# Patient Record
Sex: Male | Born: 1965 | Race: White | Hispanic: No | Marital: Married | Smoking: Never smoker
Health system: Southern US, Community
[De-identification: ages and names within clinical notes are randomized; demographics above are authoritative.]

## PROBLEM LIST (undated history)

## (undated) DIAGNOSIS — G8929 Other chronic pain: Secondary | ICD-10-CM

## (undated) DIAGNOSIS — E291 Testicular hypofunction: Secondary | ICD-10-CM

## (undated) DIAGNOSIS — G629 Polyneuropathy, unspecified: Secondary | ICD-10-CM

## (undated) DIAGNOSIS — F419 Anxiety disorder, unspecified: Secondary | ICD-10-CM

## (undated) HISTORY — PX: ABDOMINAL SURGERY: SHX537

## (undated) HISTORY — PX: SHOULDER SURGERY: SHX246

---

## 2016-04-21 ENCOUNTER — Emergency Department (HOSPITAL_BASED_OUTPATIENT_CLINIC_OR_DEPARTMENT_OTHER)
Admission: EM | Admit: 2016-04-21 | Discharge: 2016-04-21 | Disposition: A | Discharge status: Home / Self Care | Payer: BLUE CROSS/BLUE SHIELD | Attending: Emergency Medicine | Admitting: Emergency Medicine

## 2016-04-21 ENCOUNTER — Encounter (HOSPITAL_BASED_OUTPATIENT_CLINIC_OR_DEPARTMENT_OTHER): Payer: Self-pay | Admitting: *Deleted

## 2016-04-21 DIAGNOSIS — E860 Dehydration: Secondary | ICD-10-CM | POA: Diagnosis not present

## 2016-04-21 DIAGNOSIS — R200 Anesthesia of skin: Secondary | ICD-10-CM | POA: Diagnosis present

## 2016-04-21 DIAGNOSIS — Z79899 Other long term (current) drug therapy: Secondary | ICD-10-CM | POA: Insufficient documentation

## 2016-04-21 DIAGNOSIS — R030 Elevated blood-pressure reading, without diagnosis of hypertension: Secondary | ICD-10-CM

## 2016-04-21 DIAGNOSIS — R5383 Other fatigue: Secondary | ICD-10-CM

## 2016-04-21 HISTORY — DX: Anxiety disorder, unspecified: F41.9

## 2016-04-21 LAB — CBC WITH DIFFERENTIAL/PLATELET
BASOS ABS: 0 10*3/uL (ref 0.0–0.1)
BASOS PCT: 0 %
EOS ABS: 0.3 10*3/uL (ref 0.0–0.7)
Eosinophils Relative: 4 %
HEMATOCRIT: 46.5 % (ref 39.0–52.0)
HEMOGLOBIN: 17.1 g/dL — AB (ref 13.0–17.0)
Lymphocytes Relative: 34 %
Lymphs Abs: 2.8 10*3/uL (ref 0.7–4.0)
MCH: 33.3 pg (ref 26.0–34.0)
MCHC: 36.8 g/dL — ABNORMAL HIGH (ref 30.0–36.0)
MCV: 90.6 fL (ref 78.0–100.0)
Monocytes Absolute: 0.8 10*3/uL (ref 0.1–1.0)
Monocytes Relative: 10 %
NEUTROS ABS: 4.4 10*3/uL (ref 1.7–7.7)
NEUTROS PCT: 53 %
Platelets: 244 10*3/uL (ref 150–400)
RBC: 5.13 MIL/uL (ref 4.22–5.81)
RDW: 11.6 % (ref 11.5–15.5)
WBC: 8.3 10*3/uL (ref 4.0–10.5)

## 2016-04-21 LAB — COMPREHENSIVE METABOLIC PANEL
ALBUMIN: 4 g/dL (ref 3.5–5.0)
ALK PHOS: 78 U/L (ref 38–126)
ALT: 26 U/L (ref 17–63)
ANION GAP: 11 (ref 5–15)
AST: 41 U/L (ref 15–41)
BILIRUBIN TOTAL: 0.7 mg/dL (ref 0.3–1.2)
BUN: 14 mg/dL (ref 6–20)
CO2: 25 mmol/L (ref 22–32)
Calcium: 8.8 mg/dL — ABNORMAL LOW (ref 8.9–10.3)
Chloride: 101 mmol/L (ref 101–111)
Creatinine, Ser: 0.95 mg/dL (ref 0.61–1.24)
GFR calc Af Amer: >60 mL/min (ref >60)
GFR calc non Af Amer: >60 mL/min (ref >60)
GLUCOSE: 90 mg/dL (ref 65–99)
POTASSIUM: 3.3 mmol/L — AB (ref 3.5–5.1)
SODIUM: 137 mmol/L (ref 135–145)
TOTAL PROTEIN: 7.3 g/dL (ref 6.5–8.1)

## 2016-04-21 LAB — CBG MONITORING, ED: GLUCOSE-CAPILLARY: 103 mg/dL — AB (ref 65–99)

## 2016-04-21 MED ORDER — GI COCKTAIL ~~LOC~~
30.0000 mL | Freq: Once | ORAL | Status: AC
  Administered 2016-04-21: 30 mL via ORAL
  Filled 2016-04-21: qty 30

## 2016-04-21 MED ORDER — SODIUM CHLORIDE 0.9 % IV BOLUS (SEPSIS)
1000.0000 mL | Freq: Once | INTRAVENOUS | Status: AC
  Administered 2016-04-21: 1000 mL via INTRAVENOUS

## 2016-04-21 NOTE — ED Notes (Signed)
Family at bedside. 

## 2016-04-21 NOTE — ED Notes (Signed)
ED Provider at bedside. 

## 2016-04-21 NOTE — ED Provider Notes (Signed)
[facility]   CSN: [id number] Arrival date & time: 04/21/16  1507 By signing my name below, I, Levon HedgerElizabeth Hall, attest that this documentation has been prepared under the direction and in the presence of Nira ConnPedro Eduardo Rayya Yagi, MD . Electronically Signed: Levon HedgerElizabeth Hall, Scribe. 04/21/2016. 4:15 PM.   History   Chief Complaint Chief Complaint  Patient presents with  . Numbness    HPI Jeremiah Jordan is a 50 y.o. male with a hx of anxiety who presents to the Emergency Department complaining of sudden onset, constant, worsening numbness to his bilateral hands which began at 2:40 this afternoon. He states he feels "constricted" and that it takes increased concentration and effort to lift his arms. Pt states he went to bed late last night after drinking more than usual and woke up at noon with "a basic hangover". He states he took his Ambien last night. Per pt, at 12:30 he felt as he was not safe to drive due to delayed responsiveness. He notes associated generalized weakness which he describes as feeling "weighted down". No alleviating or modifying factors noted.  No treatments tried PTA. Per pt, he started on Lexapro 2 months ago. No recent illnesses or infections. No hx of HTN, HLD or DM. Pt denies any fever, CP, SOB, nausea, or vomiting.   The history is provided by the patient. No language interpreter was used.   Past Medical History:  Diagnosis Date  . Anxiety     There are no active problems to display for this patient.   Past Surgical History:  Procedure Laterality Date  . ABDOMINAL SURGERY    . SHOULDER SURGERY      Home Medications    Prior to Admission medications   Medication Sig Start Date End Date Taking? Authorizing Provider  Escitalopram Oxalate (LEXAPRO PO) Take by mouth.   Yes Historical Provider, MD  Zolpidem Tartrate (AMBIEN PO) Take by mouth.   Yes Historical Provider, MD    Family History No family history on file.  Social  History Social History  Substance Use Topics  . Smoking status: Never Smoker  . Smokeless tobacco: Never Used  . Alcohol use Yes     Comment: daily     Allergies   Codeine   Review of Systems Review of Systems 10 systems reviewed and all are negative for acute change except as noted in the HPI.   Physical Exam Updated Vital Signs BP (!) 161/122   Pulse 91   Temp 98.2 F (36.8 C) (Oral)   Resp 21   Ht 5\' 9"  (1.753 m)   Wt 215 lb (97.5 kg)   SpO2 95%   BMI 31.75 kg/m   Physical Exam  Constitutional: He is oriented to person, place, and time. He appears well-developed and well-nourished. No distress.  HENT:  Head: Normocephalic and atraumatic.  Nose: Nose normal.  Eyes: Conjunctivae and EOM are normal. Pupils are equal, round, and reactive to light. Right eye exhibits no discharge. Left eye exhibits no discharge. No scleral icterus.  Neck: Normal range of motion. Neck supple.  Cardiovascular: Normal rate and regular rhythm.  Exam reveals no gallop and no friction rub.   No murmur heard. Pulmonary/Chest: Effort normal and breath sounds normal. No stridor. No respiratory distress. He has no rales.  Abdominal: Soft. He exhibits no distension. There is no tenderness.  Musculoskeletal: He exhibits no edema or tenderness.  Neurological: He is alert and oriented to person, place, and time.  Mental Status: Alert  and oriented to person, place, and time. Attention and concentration normal. Speech clear. Recent memory is intac  Cranial Nerves  II Visual Fields: Intact to confrontation. Visual fields intact. III, IV, VI: Pupils equal and reactive to light and near. Full eye movement without nystagmus  V Facial Sensation: Normal. No weakness of masticatory muscles  VII: No facial weakness or asymmetry  VIII Auditory Acuity: Grossly normal  IX/X: The uvula is midline; the palate elevates symmetrically  XI: Normal sternocleidomastoid and trapezius strength  XII: The tongue is  midline. No atrophy or fasciculations.   Motor System: Muscle Strength: 5/5 and symmetric in the upper and lower extremities. No pronation or drift.  Muscle Tone: Tone and muscle bulk are normal in the upper and lower extremities.   Reflexes: DTRs: 1+ and symmetrical in all four extremities. Plantar responses are flexor bilaterally.  Coordination: Intact finger-to-nose, heel-to-shin, and rapid alternating movements. No tremor.  Sensation: Intact to light touch, and pinprick.  Gait: Routine gait  normal    Skin: Skin is warm and dry. No rash noted. He is not diaphoretic. No erythema.  Psychiatric: He has a normal mood and affect.  Vitals reviewed.   ED Treatments / Results  DIAGNOSTIC STUDIES:  Oxygen Saturation is 93% on RA, low by my interpretation.    COORDINATION OF CARE:  4:10 PM Will order CMP and CBC. Discussed treatment plan with pt at bedside and pt agreed to plan.  Labs (all labs ordered are listed, but only abnormal results are displayed) Labs Reviewed  CBC WITH DIFFERENTIAL/PLATELET - Abnormal; Notable for the following:       Result Value   Hemoglobin 17.1 (*)    MCHC 36.8 (*)    All other components within normal limits  COMPREHENSIVE METABOLIC PANEL - Abnormal; Notable for the following:    Potassium 3.3 (*)    Calcium 8.8 (*)    All other components within normal limits  CBG MONITORING, ED - Abnormal; Notable for the following:    Glucose-Capillary 103 (*)    All other components within normal limits    EKG  EKG Interpretation  Date/Time:  Sunday April 21 2016 15:30:03 EST Ventricular Rate:  79 PR Interval:    QRS Duration: 108 QT Interval:  417 QTC Calculation: 478 R Axis:   67 Text Interpretation:  Sinus rhythm Low voltage, precordial leads Borderline prolonged QT interval No old tracing to compare Confirmed by [facility] MD, Jennings Corado ([id number]) on 04/21/2016 3:52:20 PM       Radiology No results found.  Procedures Procedures (including critical  care time)  Medications Ordered in ED Medications  sodium chloride 0.9 % bolus 1,000 mL (0 mLs Intravenous Stopped 04/21/16 1738)  gi cocktail (Maalox,Lidocaine,Donnatal) (30 mLs Oral Given 04/21/16 1904)     Initial Impression / Assessment and Plan / ED Course  I have reviewed the triage vital signs and the nursing notes.  Pertinent labs & imaging results that were available during my care of the patient were reviewed by me and considered in my medical decision making (see chart for details).  Clinical Course     Presentation most consistent with medication side effects/interaction in the setting of alcohol consumption. Also likely dehydration. Presentation isn't consistent with CVA, ACS. EKG without acute ischemic changes or dysrhythmias. Labs grossly reassuring however did note some hemoconcentration consistent with dehydration.  Patient noted to be hypertensive but reports that this typically happens when coming to the doctor's office.  He did complain of acid reflux  but denies any chest pain. This completely resolved following GI cocktail.  Patient provided with IV fluids which has significant improvement in patient's symptomatology.  Labs without evidence of end organ damage.  The patient is safe for discharge with strict return precautions and close PCP follow-up  Final Clinical Impressions(s) / ED Diagnoses   Final diagnoses:  Fatigue, unspecified type  Dehydration  Elevated blood pressure reading   Disposition: Discharge  Condition: Good  I have discussed the results, Dx and Tx plan with the patient who expressed understanding and agree(s) with the plan. Discharge instructions discussed at great length. The patient was given strict return precautions who verbalized understanding of the instructions. No further questions at time of discharge.    Current Discharge Medication List      Follow Up: Primary care provider  Schedule an appointment as soon as  possible for a visit  For close follow up to possible medication side effect/interaction and high blood pressure management as needed   I personally performed the services described in this documentation, which was scribed in my presence. The recorded information has been reviewed and is accurate.        Nira Conn, MD 04/21/16 [id number]

## 2016-04-21 NOTE — ED Notes (Signed)
Pt reports that he was beginning to feel better post bolus sensation has returned to the bilateral distal extremities. Still reports difficulty gathering his thoughts. Speaking full clear sentences NAD.

## 2016-04-21 NOTE — ED Notes (Signed)
Pt provided with sprite.  °

## 2016-04-21 NOTE — ED Triage Notes (Addendum)
States his hands are numb and tingling. He drove this am and did not feel right. He went to sleep when he got home and when he up his arms were numb and he had difficulty speaking.  He feels jittery. 3 days ago he had a similar episode. His speech is clear.

## 2018-04-30 ENCOUNTER — Emergency Department (HOSPITAL_BASED_OUTPATIENT_CLINIC_OR_DEPARTMENT_OTHER)
Admission: EM | Admit: 2018-04-30 | Discharge: 2018-04-30 | Disposition: A | Discharge status: Home / Self Care | Payer: BLUE CROSS/BLUE SHIELD | Attending: Emergency Medicine | Admitting: Emergency Medicine

## 2018-04-30 ENCOUNTER — Emergency Department (HOSPITAL_BASED_OUTPATIENT_CLINIC_OR_DEPARTMENT_OTHER): Payer: BLUE CROSS/BLUE SHIELD

## 2018-04-30 ENCOUNTER — Encounter (HOSPITAL_BASED_OUTPATIENT_CLINIC_OR_DEPARTMENT_OTHER): Payer: Self-pay

## 2018-04-30 ENCOUNTER — Other Ambulatory Visit: Payer: Self-pay

## 2018-04-30 DIAGNOSIS — Z79899 Other long term (current) drug therapy: Secondary | ICD-10-CM | POA: Diagnosis not present

## 2018-04-30 DIAGNOSIS — R410 Disorientation, unspecified: Secondary | ICD-10-CM | POA: Diagnosis not present

## 2018-04-30 DIAGNOSIS — I200 Unstable angina: Secondary | ICD-10-CM

## 2018-04-30 DIAGNOSIS — R079 Chest pain, unspecified: Secondary | ICD-10-CM | POA: Diagnosis present

## 2018-04-30 LAB — COMPREHENSIVE METABOLIC PANEL
ALBUMIN: 4.2 g/dL (ref 3.5–5.0)
ALK PHOS: 44 U/L (ref 38–126)
ALT: 16 U/L (ref 0–44)
AST: 20 U/L (ref 15–41)
Anion gap: 8 (ref 5–15)
BUN: 19 mg/dL (ref 6–20)
CHLORIDE: 104 mmol/L (ref 98–111)
CO2: 28 mmol/L (ref 22–32)
Calcium: 9.2 mg/dL (ref 8.9–10.3)
Creatinine, Ser: 0.98 mg/dL (ref 0.61–1.24)
GFR calc non Af Amer: >60 mL/min (ref >60)
GLUCOSE: 88 mg/dL (ref 70–99)
Potassium: 4.1 mmol/L (ref 3.5–5.1)
SODIUM: 140 mmol/L (ref 135–145)
Total Bilirubin: 0.9 mg/dL (ref 0.3–1.2)
Total Protein: 7.2 g/dL (ref 6.5–8.1)

## 2018-04-30 LAB — TROPONIN I: Troponin I: <0.03 ng/mL (ref <0.03)

## 2018-04-30 LAB — URINALYSIS, ROUTINE W REFLEX MICROSCOPIC
BILIRUBIN URINE: NEGATIVE
GLUCOSE, UA: NEGATIVE mg/dL
Hgb urine dipstick: NEGATIVE
Ketones, ur: NEGATIVE mg/dL
Leukocytes, UA: NEGATIVE
NITRITE: NEGATIVE
PH: 6.5 (ref 5.0–8.0)
Protein, ur: NEGATIVE mg/dL
SPECIFIC GRAVITY, URINE: 1.02 (ref 1.005–1.030)

## 2018-04-30 LAB — CBC WITH DIFFERENTIAL/PLATELET
ABS IMMATURE GRANULOCYTES: 0.03 10*3/uL (ref 0.00–0.07)
BASOS ABS: 0 10*3/uL (ref 0.0–0.1)
Basophils Relative: 0 %
Eosinophils Absolute: 0.2 10*3/uL (ref 0.0–0.5)
Eosinophils Relative: 3 %
HCT: 43.9 % (ref 39.0–52.0)
Hemoglobin: 14.7 g/dL (ref 13.0–17.0)
IMMATURE GRANULOCYTES: 0 %
LYMPHS PCT: 26 %
Lymphs Abs: 2.4 10*3/uL (ref 0.7–4.0)
MCH: 31 pg (ref 26.0–34.0)
MCHC: 33.5 g/dL (ref 30.0–36.0)
MCV: 92.6 fL (ref 80.0–100.0)
Monocytes Absolute: 0.8 10*3/uL (ref 0.1–1.0)
Monocytes Relative: 9 %
NEUTROS ABS: 5.8 10*3/uL (ref 1.7–7.7)
NEUTROS PCT: 62 %
NRBC: 0 % (ref 0.0–0.2)
PLATELETS: 219 10*3/uL (ref 150–400)
RBC: 4.74 MIL/uL (ref 4.22–5.81)
RDW: 11.5 % (ref 11.5–15.5)
WBC: 9.3 10*3/uL (ref 4.0–10.5)

## 2018-04-30 NOTE — ED Triage Notes (Signed)
Pt c/o n/v, fatigue, feeling of confusion, anxiety, HA-sx started 3-4 weeks-seen by PCP-no studies-rx phenergan-sx worse x 2 weeks-was unable to be seen by PCP again-pt NAD-steady gait

## 2018-04-30 NOTE — ED Notes (Signed)
Pt has no complaints at this time; sts he had several episodes of CP, LT arm numbness and "couldn't get my thoughts straight" over the past few weeks.

## 2018-04-30 NOTE — Discharge Instructions (Addendum)
We advised you to stay for chest pain admission but you chose to go home. Please follow up with your primary care physician. Your studies today such as xray and blood work were within normal limits. Please return to the ED if your symptoms worsen or you experience any chest pain or shortness of breath.

## 2018-04-30 NOTE — ED Notes (Signed)
Patient transported to X-ray 
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
## 2018-05-12 ENCOUNTER — Telehealth (HOSPITAL_COMMUNITY): Payer: Self-pay | Admitting: Licensed Clinical Social Worker

## 2018-05-12 NOTE — Telephone Encounter (Signed)
Returned phone call. Called PT to discuss possible entry into CD-IOP. Pt states he was "just asking for options for treatment" and is not currently interested in IOP. He is interested in medication management and I provided him the information to make an appointment w/ a Doctor here.

## 2020-11-23 ENCOUNTER — Telehealth: Payer: Self-pay

## 2020-12-12 NOTE — Telephone Encounter (Signed)
error 

## 2021-06-29 ENCOUNTER — Other Ambulatory Visit: Payer: Self-pay | Admitting: Orthopedic Surgery

## 2021-06-29 DIAGNOSIS — M259 Joint disorder, unspecified: Secondary | ICD-10-CM

## 2021-07-20 ENCOUNTER — Ambulatory Visit
Admission: RE | Admit: 2021-07-20 | Discharge: 2021-07-20 | Disposition: A | Discharge status: Home / Self Care | Payer: PRIVATE HEALTH INSURANCE | Source: Ambulatory Visit | Attending: Orthopedic Surgery | Admitting: Orthopedic Surgery

## 2021-07-20 ENCOUNTER — Other Ambulatory Visit: Payer: Self-pay

## 2021-07-20 DIAGNOSIS — M259 Joint disorder, unspecified: Secondary | ICD-10-CM

## 2021-07-20 MED ORDER — METHYLPREDNISOLONE ACETATE 40 MG/ML INJ SUSP (RADIOLOG: 80.0000 mg | Freq: Once | INTRAMUSCULAR | Status: DC

## 2021-11-08 ENCOUNTER — Other Ambulatory Visit: Payer: Self-pay

## 2021-11-08 DIAGNOSIS — M5431 Sciatica, right side: Secondary | ICD-10-CM

## 2022-10-16 ENCOUNTER — Encounter (HOSPITAL_BASED_OUTPATIENT_CLINIC_OR_DEPARTMENT_OTHER): Payer: Self-pay | Admitting: Emergency Medicine

## 2022-10-16 ENCOUNTER — Other Ambulatory Visit: Payer: Self-pay

## 2022-10-16 ENCOUNTER — Other Ambulatory Visit (HOSPITAL_BASED_OUTPATIENT_CLINIC_OR_DEPARTMENT_OTHER): Payer: Self-pay

## 2022-10-16 ENCOUNTER — Observation Stay (HOSPITAL_BASED_OUTPATIENT_CLINIC_OR_DEPARTMENT_OTHER)
Admission: EM | Admit: 2022-10-16 | Discharge: 2022-10-17 | Disposition: A | Discharge status: Home / Self Care | Payer: 59 | Attending: Emergency Medicine | Admitting: Emergency Medicine

## 2022-10-16 ENCOUNTER — Emergency Department (HOSPITAL_BASED_OUTPATIENT_CLINIC_OR_DEPARTMENT_OTHER): Payer: 59

## 2022-10-16 DIAGNOSIS — G629 Polyneuropathy, unspecified: Secondary | ICD-10-CM | POA: Diagnosis present

## 2022-10-16 DIAGNOSIS — G8929 Other chronic pain: Secondary | ICD-10-CM | POA: Diagnosis present

## 2022-10-16 DIAGNOSIS — K56600 Partial intestinal obstruction, unspecified as to cause: Secondary | ICD-10-CM | POA: Diagnosis not present

## 2022-10-16 DIAGNOSIS — Z79899 Other long term (current) drug therapy: Secondary | ICD-10-CM | POA: Diagnosis not present

## 2022-10-16 DIAGNOSIS — N486 Induration penis plastica: Secondary | ICD-10-CM | POA: Diagnosis present

## 2022-10-16 DIAGNOSIS — Z885 Allergy status to narcotic agent status: Secondary | ICD-10-CM | POA: Diagnosis not present

## 2022-10-16 DIAGNOSIS — Z888 Allergy status to other drugs, medicaments and biological substances status: Secondary | ICD-10-CM

## 2022-10-16 DIAGNOSIS — F419 Anxiety disorder, unspecified: Secondary | ICD-10-CM | POA: Diagnosis present

## 2022-10-16 DIAGNOSIS — K56609 Unspecified intestinal obstruction, unspecified as to partial versus complete obstruction: Secondary | ICD-10-CM | POA: Diagnosis present

## 2022-10-16 DIAGNOSIS — Z981 Arthrodesis status: Secondary | ICD-10-CM

## 2022-10-16 DIAGNOSIS — E785 Hyperlipidemia, unspecified: Secondary | ICD-10-CM | POA: Diagnosis not present

## 2022-10-16 DIAGNOSIS — R10819 Abdominal tenderness, unspecified site: Secondary | ICD-10-CM | POA: Diagnosis present

## 2022-10-16 HISTORY — DX: Polyneuropathy, unspecified: G62.9

## 2022-10-16 HISTORY — DX: Other chronic pain: G89.29

## 2022-10-16 HISTORY — DX: Testicular hypofunction: E29.1

## 2022-10-16 LAB — URINALYSIS, ROUTINE W REFLEX MICROSCOPIC
Bilirubin Urine: NEGATIVE
Glucose, UA: NEGATIVE mg/dL
Hgb urine dipstick: NEGATIVE
Ketones, ur: NEGATIVE mg/dL
Leukocytes,Ua: NEGATIVE
Nitrite: NEGATIVE
Protein, ur: NEGATIVE mg/dL
Specific Gravity, Urine: 1.045 — ABNORMAL HIGH (ref 1.005–1.030)
pH: 7 (ref 5.0–8.0)

## 2022-10-16 LAB — LIPASE, BLOOD: Lipase: 19 U/L (ref 11–51)

## 2022-10-16 LAB — COMPREHENSIVE METABOLIC PANEL
ALT: 25 U/L (ref 0–44)
AST: 27 U/L (ref 15–41)
Albumin: 4.5 g/dL (ref 3.5–5.0)
Alkaline Phosphatase: 53 U/L (ref 38–126)
Anion gap: 8 (ref 5–15)
BUN: 14 mg/dL (ref 6–20)
CO2: 27 mmol/L (ref 22–32)
Calcium: 9.3 mg/dL (ref 8.9–10.3)
Chloride: 104 mmol/L (ref 98–111)
Creatinine, Ser: 1.15 mg/dL (ref 0.61–1.24)
GFR, Estimated: >60 mL/min (ref >60)
Glucose, Bld: 91 mg/dL (ref 70–99)
Potassium: 4.2 mmol/L (ref 3.5–5.1)
Sodium: 139 mmol/L (ref 135–145)
Total Bilirubin: 0.9 mg/dL (ref 0.3–1.2)
Total Protein: 7.1 g/dL (ref 6.5–8.1)

## 2022-10-16 LAB — CBC
HCT: 46.9 % (ref 39.0–52.0)
Hemoglobin: 16.5 g/dL (ref 13.0–17.0)
MCH: 30.9 pg (ref 26.0–34.0)
MCHC: 35.2 g/dL (ref 30.0–36.0)
MCV: 87.8 fL (ref 80.0–100.0)
Platelets: 228 10*3/uL (ref 150–400)
RBC: 5.34 MIL/uL (ref 4.22–5.81)
RDW: 11.9 % (ref 11.5–15.5)
WBC: 12 10*3/uL — ABNORMAL HIGH (ref 4.0–10.5)
nRBC: 0 % (ref 0.0–0.2)

## 2022-10-16 MED ORDER — ONDANSETRON HCL 4 MG/2ML IJ SOLN
4.0000 mg | Freq: Four times a day (QID) | INTRAMUSCULAR | Status: DC | PRN
  Administered 2022-10-16: 4 mg via INTRAVENOUS
  Filled 2022-10-16: qty 2

## 2022-10-16 MED ORDER — ACETAMINOPHEN 650 MG RE SUPP: 650.0000 mg | Freq: Four times a day (QID) | RECTAL | Status: DC | PRN

## 2022-10-16 MED ORDER — ONDANSETRON HCL 4 MG/2ML IJ SOLN
4.0000 mg | Freq: Once | INTRAMUSCULAR | Status: AC
  Administered 2022-10-16: 4 mg via INTRAVENOUS
  Filled 2022-10-16: qty 2

## 2022-10-16 MED ORDER — ZOLPIDEM TARTRATE 5 MG PO TABS
10.0000 mg | ORAL_TABLET | Freq: Every day | ORAL | Status: DC
  Administered 2022-10-16: 10 mg via ORAL
  Filled 2022-10-16 (×2): qty 2

## 2022-10-16 MED ORDER — MORPHINE SULFATE (PF) 2 MG/ML IV SOLN
2.0000 mg | INTRAVENOUS | Status: DC | PRN
  Administered 2022-10-16: 2 mg via INTRAVENOUS
  Filled 2022-10-16: qty 1

## 2022-10-16 MED ORDER — IOHEXOL 300 MG/ML  SOLN
100.0000 mL | Freq: Once | INTRAMUSCULAR | Status: AC | PRN
  Administered 2022-10-16: 85 mL via INTRAVENOUS

## 2022-10-16 MED ORDER — ONDANSETRON 4 MG PO TBDP: 4.0000 mg | ORAL_TABLET | Freq: Four times a day (QID) | ORAL | Status: DC | PRN

## 2022-10-16 MED ORDER — METHOCARBAMOL 750 MG PO TABS
750.0000 mg | ORAL_TABLET | Freq: Three times a day (TID) | ORAL | Status: DC
  Administered 2022-10-16 – 2022-10-17 (×3): 750 mg via ORAL
  Filled 2022-10-16 (×4): qty 1

## 2022-10-16 MED ORDER — FENTANYL CITRATE PF 50 MCG/ML IJ SOSY
50.0000 ug | PREFILLED_SYRINGE | Freq: Once | INTRAMUSCULAR | Status: AC
  Administered 2022-10-16: 50 ug via INTRAVENOUS
  Filled 2022-10-16: qty 1

## 2022-10-16 MED ORDER — DIATRIZOATE MEGLUMINE & SODIUM 66-10 % PO SOLN
90.0000 mL | Freq: Once | ORAL | Status: AC
  Administered 2022-10-16: 90 mL via ORAL
  Filled 2022-10-16: qty 90

## 2022-10-16 MED ORDER — LAMOTRIGINE 25 MG PO TABS
150.0000 mg | ORAL_TABLET | Freq: Every day | ORAL | Status: DC
  Administered 2022-10-17: 150 mg via ORAL
  Filled 2022-10-16: qty 6

## 2022-10-16 MED ORDER — GABAPENTIN 300 MG PO CAPS
300.0000 mg | ORAL_CAPSULE | Freq: Every day | ORAL | Status: DC
  Administered 2022-10-16: 300 mg via ORAL
  Filled 2022-10-16 (×2): qty 1

## 2022-10-16 MED ORDER — HYDRALAZINE HCL 20 MG/ML IJ SOLN: 10.0000 mg | INTRAMUSCULAR | Status: DC | PRN

## 2022-10-16 MED ORDER — LACTATED RINGERS IV SOLN: INTRAVENOUS | Status: DC

## 2022-10-16 MED ORDER — ATORVASTATIN CALCIUM 10 MG PO TABS
20.0000 mg | ORAL_TABLET | Freq: Every day | ORAL | Status: DC
  Administered 2022-10-17: 20 mg via ORAL
  Filled 2022-10-16: qty 2

## 2022-10-16 MED ORDER — ACETAMINOPHEN 325 MG PO TABS: 650.0000 mg | ORAL_TABLET | Freq: Four times a day (QID) | ORAL | Status: DC | PRN
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
## 2022-10-16 NOTE — ED Triage Notes (Signed)
Pt arrives to ED with c/o abdominal pain that started this morning.  
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
## 2022-10-16 NOTE — ED Notes (Signed)
Patient transported to CT 
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
## 2022-10-16 NOTE — ED Notes (Signed)
[facility] at [facility] will send transport for Bed Ready at [facility] 2W Room# 35.-ABB(NS)

## 2022-10-17 ENCOUNTER — Inpatient Hospital Stay (HOSPITAL_COMMUNITY): Payer: 59

## 2022-10-17 DIAGNOSIS — K56609 Unspecified intestinal obstruction, unspecified as to partial versus complete obstruction: Secondary | ICD-10-CM | POA: Diagnosis not present

## 2022-10-17 LAB — BASIC METABOLIC PANEL
Anion gap: 8 (ref 5–15)
BUN: 12 mg/dL (ref 6–20)
CO2: 24 mmol/L (ref 22–32)
Calcium: 8.9 mg/dL (ref 8.9–10.3)
Chloride: 106 mmol/L (ref 98–111)
Creatinine, Ser: 1.24 mg/dL (ref 0.61–1.24)
GFR, Estimated: >60 mL/min (ref >60)
Glucose, Bld: 91 mg/dL (ref 70–99)
Potassium: 3.8 mmol/L (ref 3.5–5.1)
Sodium: 138 mmol/L (ref 135–145)

## 2022-10-17 LAB — CBC
HCT: 46.3 % (ref 39.0–52.0)
Hemoglobin: 16.3 g/dL (ref 13.0–17.0)
MCH: 31.5 pg (ref 26.0–34.0)
MCHC: 35.2 g/dL (ref 30.0–36.0)
MCV: 89.4 fL (ref 80.0–100.0)
Platelets: 229 10*3/uL (ref 150–400)
RBC: 5.18 MIL/uL (ref 4.22–5.81)
RDW: 12.2 % (ref 11.5–15.5)
WBC: 11.6 10*3/uL — ABNORMAL HIGH (ref 4.0–10.5)
nRBC: 0 % (ref 0.0–0.2)

## 2022-10-17 LAB — HIV ANTIBODY (ROUTINE TESTING W REFLEX): HIV Screen 4th Generation wRfx: NONREACTIVE
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
## 2022-10-17 NOTE — Progress Notes (Signed)
Subjective: A little sore in his abdomen still, but has had over 9 BMs since yesterday.  hungry  ROS: See above, otherwise other systems negative  Objective: Vital signs in last 24 hours: Temp:  [97.7 F (36.5 C)-98.3 F (36.8 C)] 98.3 F (36.8 C) (06/20 0214) Pulse Rate:  [66-82] 70 (06/20 0748) Resp:  [15-19] 19 (06/20 0214) BP: (118-187)/(78-125) 121/78 (06/20 0748) SpO2:  [90 %-97 %] 92 % (06/20 0748) Weight:  [96.6 kg] 96.6 kg (06/19 0919) Last BM Date : 10/16/22  Intake/Output from previous day: 06/19 0701 - 06/20 0700 In: 841.2 [I.V.:841.2] Out: -  Intake/Output this shift: No intake/output data recorded.  PE: Abd: soft, minimally tender, +BS, ND  Lab Results:  Recent Labs    10/16/22 0920 10/17/22 0320  WBC 12.0* 11.6*  HGB 16.5 16.3  HCT 46.9 46.3  PLT 228 229   BMET Recent Labs    10/16/22 0920 10/17/22 0320  NA 139 138  K 4.2 3.8  CL 104 106  CO2 27 24  GLUCOSE 91 91  BUN 14 12  CREATININE 1.15 1.24  CALCIUM 9.3 8.9   PT/INR No results for input(s): "LABPROT", "INR" in the last 72 hours. CMP     Component Value Date/Time   NA 138 10/17/2022 0320   K 3.8 10/17/2022 0320   CL 106 10/17/2022 0320   CO2 24 10/17/2022 0320   GLUCOSE 91 10/17/2022 0320   BUN 12 10/17/2022 0320   CREATININE 1.24 10/17/2022 0320   CALCIUM 8.9 10/17/2022 0320   PROT 7.1 10/16/2022 0920   ALBUMIN 4.5 10/16/2022 0920   AST 27 10/16/2022 0920   ALT 25 10/16/2022 0920   ALKPHOS 53 10/16/2022 0920   BILITOT 0.9 10/16/2022 0920   GFRNONAA >60 10/17/2022 0320   GFRAA >60 04/30/2018 1443   Lipase     Component Value Date/Time   LIPASE 19 10/16/2022 0920       Studies/Results: CT ABDOMEN PELVIS W CONTRAST  Result Date: 10/16/2022 CLINICAL DATA:  Abdominal pain that started this morning. Previous history of splenic surgery. EXAM: CT ABDOMEN AND PELVIS WITH CONTRAST TECHNIQUE: Multidetector CT imaging of the abdomen and pelvis was performed using  the standard protocol following bolus administration of intravenous contrast. RADIATION DOSE REDUCTION: This exam was performed according to the departmental dose-optimization program which includes automated exposure control, adjustment of the mA and/or kV according to patient size and/or use of iterative reconstruction technique. CONTRAST:  85mL OMNIPAQUE IOHEXOL 300 MG/ML  SOLN COMPARISON:  Report of the CT scan July 2013. FINDINGS: Lower chest: Borderline size heart. There is some linear opacity along the lung bases likely scar or atelectasis. No pleural effusion. Coronary artery calcifications are seen. Hepatobiliary: Gallbladder is nondilated. Patent portal vein. No space-occupying liver lesion. Pancreas: Unremarkable. No pancreatic ductal dilatation or surrounding inflammatory changes. Spleen: Normal in size without focal abnormality. Adrenals/Urinary Tract: The adrenal glands are preserved. No enhancing renal mass or collecting system dilatation. The ureters have normal course and caliber extending down to the bladder. Bladder wall slightly thickened. The bladder is nondilated. Stomach/Bowel: No oral contrast. Large bowel has a normal course and caliber with scattered stool. Normal appendix. There is some fluid in the stomach. Prominent third portion duodenal diverticulum. The jejunum is nondilated. There are however several ileal loops which are distended with fluid and small bowel stool appearance. There are some adjacent inflammatory stranding along these loops in the mesentery. Diameter approaches 2.8 cm. There does  appear to be a caliber change identified along the extreme anterior abdomen as seen on coronal series 5, image 84, axial series 2, image 53. Although the loops are only distended not technically dilated by size criteria this could be a very early or developing obstruction or partial obstruction. No pneumatosis or free air. Vascular/Lymphatic: Aortic atherosclerosis. No enlarged abdominal or  pelvic lymph nodes. Reproductive: Prostate is unremarkable. Other: No abdominal wall hernia or abnormality. No abdominopelvic ascites. Musculoskeletal: Scattered degenerative changes of the spine and pelvis. There are bridging osteophytes seen along the sacroiliac joints. Right-sided partial fusion. IMPRESSION: Developing loops of small bowel, ileum with borderline dilatation, inflammatory stranding with an abrupt transition in small bowel stool appearance. This could be an early or partial developing obstruction. No pneumatosis or free air. Coronary artery calcifications. Please correlate for other coronary risk factors. Electronically Signed   By: Karen Kays M.D.   On: 10/16/2022 10:45    Anti-infectives: Anti-infectives (From admission, onward)    None        Assessment/Plan pSBO -resolved with contrast in his colon and multiple BMs -FLD, may DC home if he tolerates this.  I have written for a soft diet for lunch if he is still here. -d/w primary service.   FEN - FLD, soft at lunch VTE - per primary ID - none needed  I reviewed hospitalist notes, last 24 h vitals and pain scores, last 48 h intake and output, last 24 h labs and trends, and last 24 h imaging results.   LOS: 1 day    Letha Cape , [facility] 10/17/2022, 8:41 AM Please see Amion for pager number during day hours 7:00am-4:30pm or 7:00am -11:30am on weekends

## 2022-10-17 NOTE — Plan of Care (Signed)

## 2022-10-17 NOTE — Progress Notes (Signed)
Chaplain attempted to respond to [facility] consult. Upon arrival, his RN had just learned that the patient had just left.  Maryanna Shape. Carley Hammed, M.Div. [facility] Chaplain Pager [phone] Office [phone]

## 2022-11-23 IMAGING — CT CT BIOPSY
2 of 5 series · 13 of 32 positions shown, 19 images · non-contrast
Comparison: none

CLINICAL DATA: Low back and right buttock pain. Suspected right
sacroiliac joint dysfunction. History of prior lumbar fusion.

[Series 2: needle -guided injection · axial · 0.88mm/px · z∈[+784,+856]mm · 10 of 45 slices shown, 16 images (1 of 2)]
[im 5/45  soft-tissue]
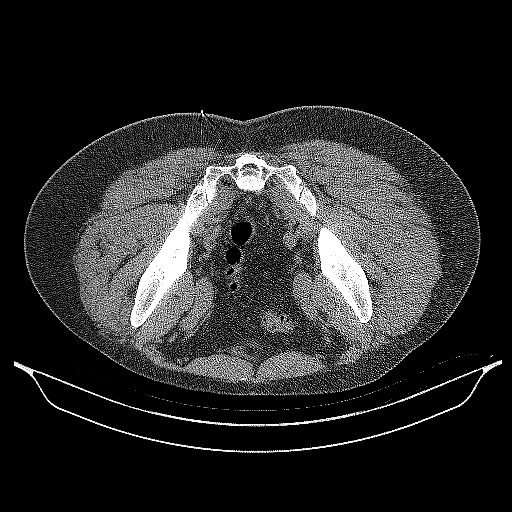
[im 5/45  bone]
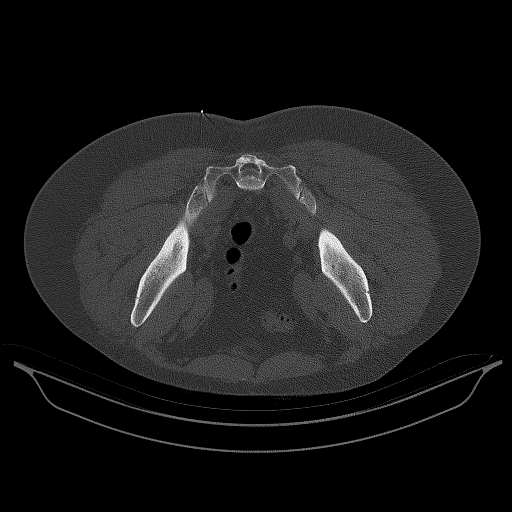
[im 9/45  soft-tissue]
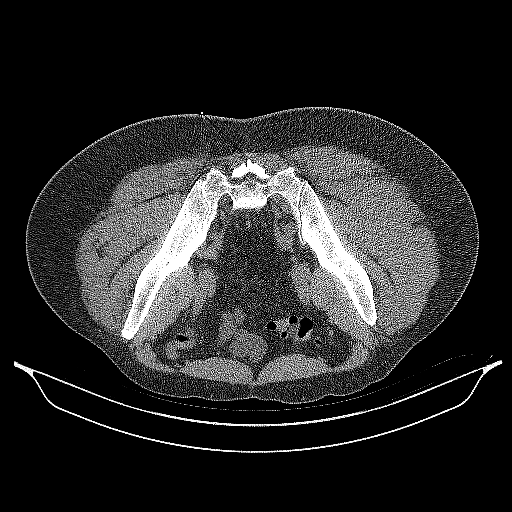
[im 13/45  soft-tissue]
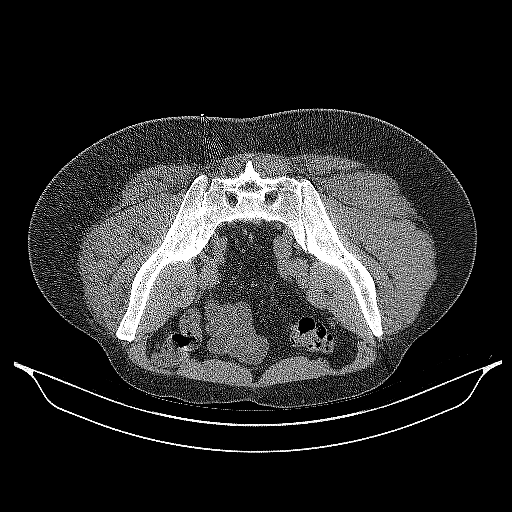
[im 17/45  soft-tissue]
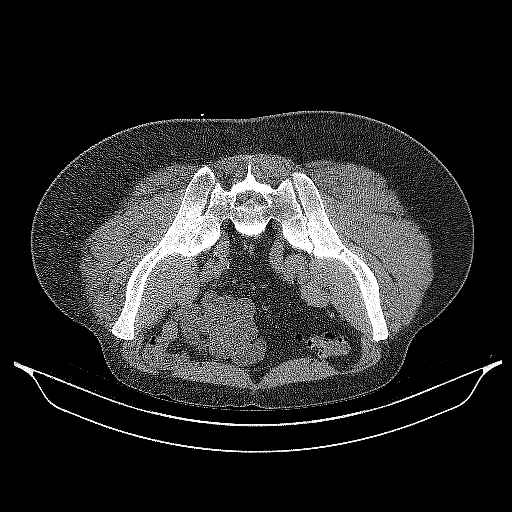
[im 21/45  soft-tissue]
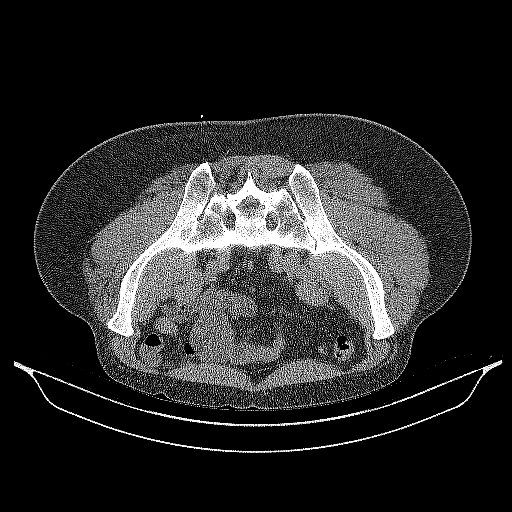
[im 25/45  soft-tissue]
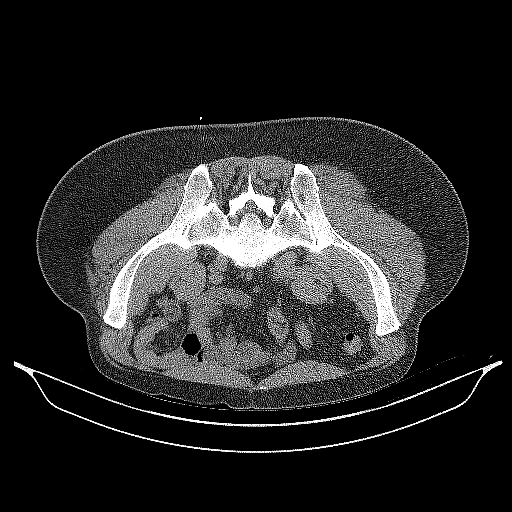
[im 29/45  soft-tissue]
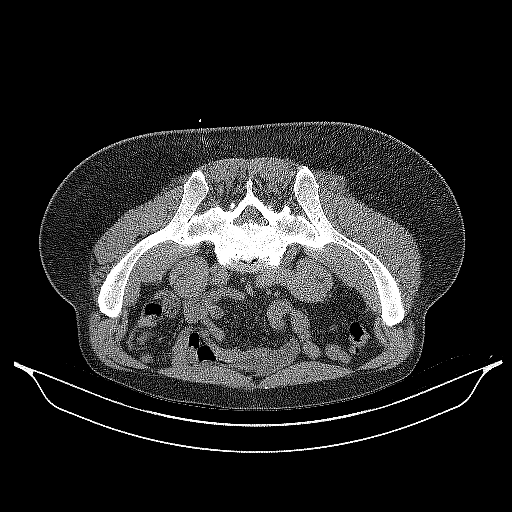
[im 29/45  lung]
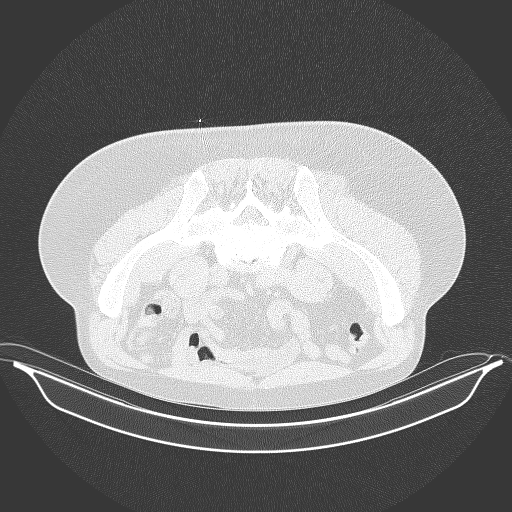
[im 33/45  soft-tissue]
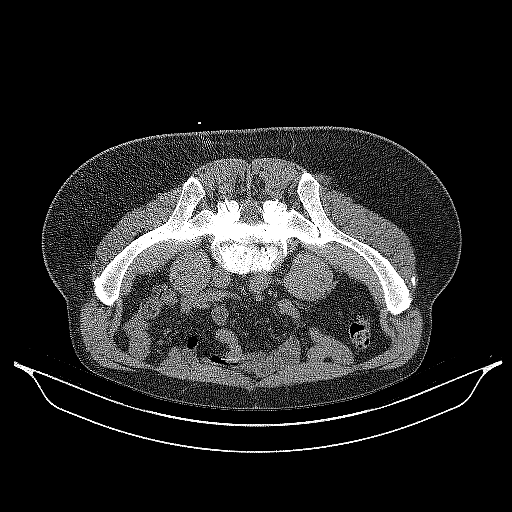
[im 33/45  lung]
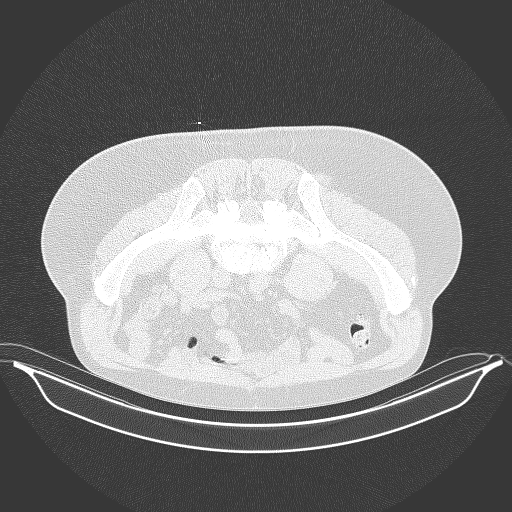
[im 37/45  soft-tissue]
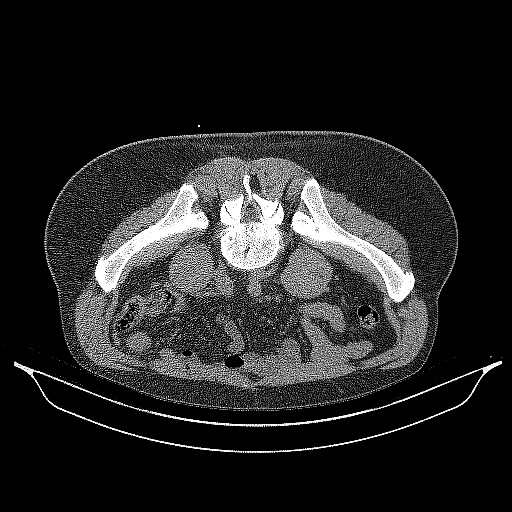
[im 37/45  lung]
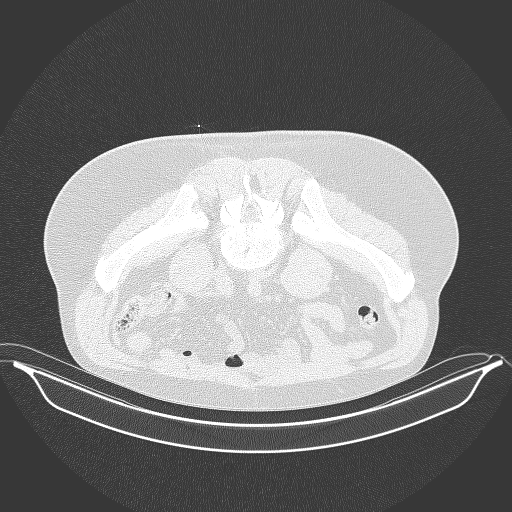
[im 37/45  bone]
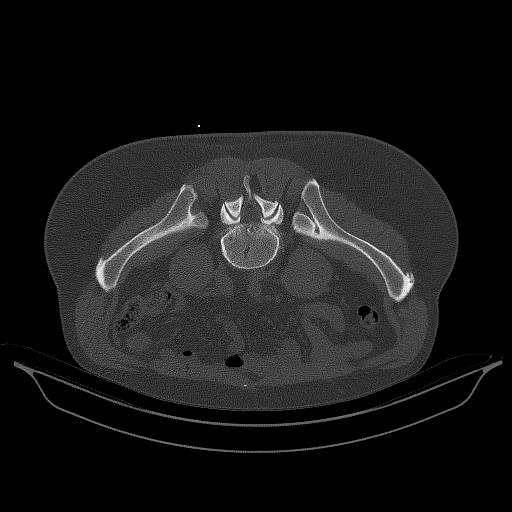
[im 41/45  soft-tissue]
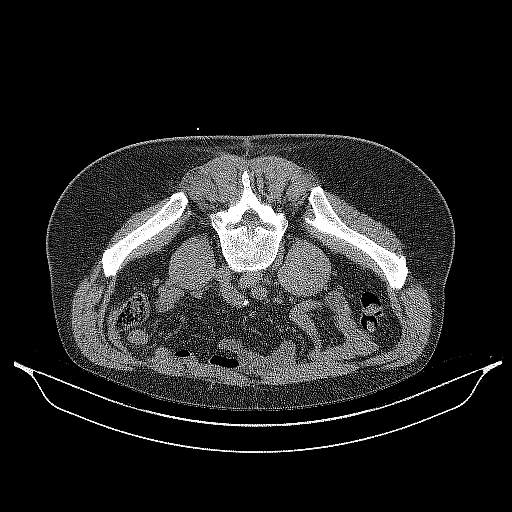
[im 41/45  lung]
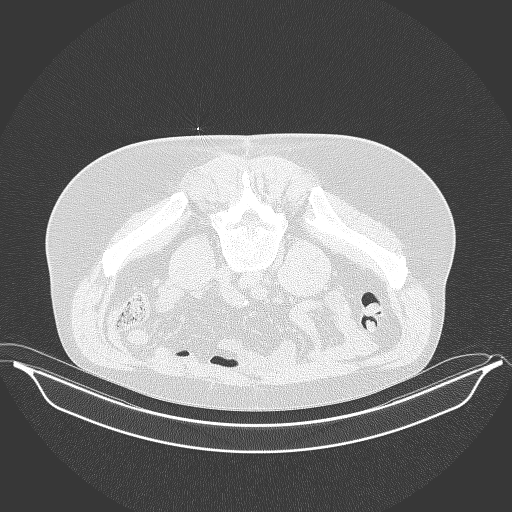

[Series 3: needle -guided injection · axial · 0.88mm/px · z∈[+775,+795]mm · 3 of 22 slices shown (2 of 2)]
[im 6/22  soft-tissue]
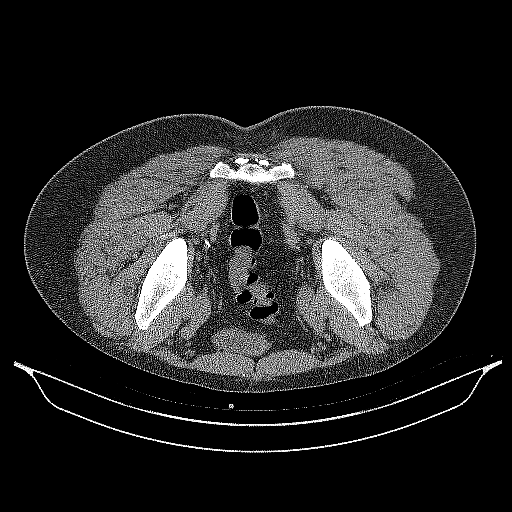
[im 11/22  soft-tissue]
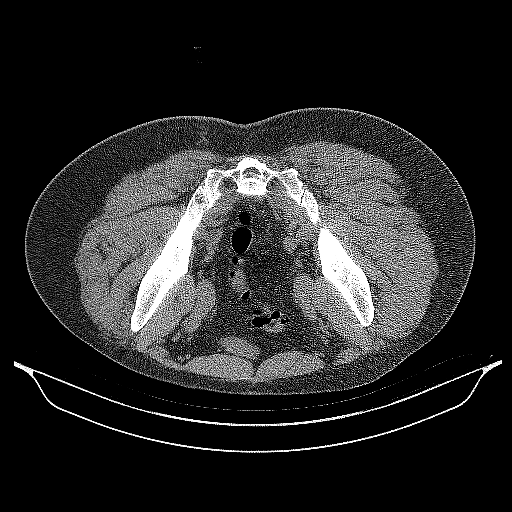
[im 16/22  soft-tissue]
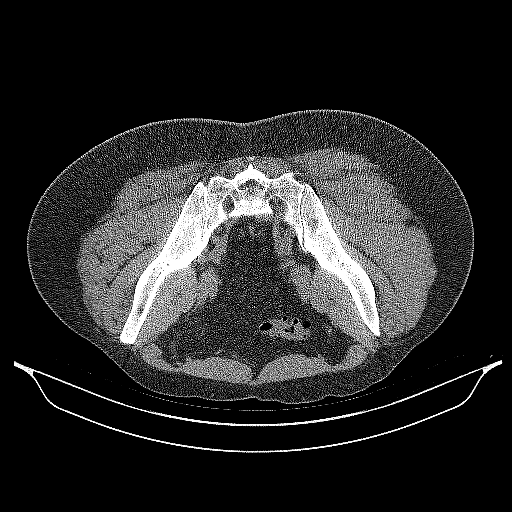

[13 of 32 positions shown; findings below may reference images not displayed]

EXAM:
CT-GUIDED RIGHT SACROILIAC JOINT INJECTION

PROCEDURE:
After a thorough discussion of risks and benefits of the procedure,
verbal and written consent was obtained. The patient was placed
prone on the CT table and localization was performed over the
sacrum. Incidental note is made of a partially fused right
sacroiliac joint. Target site was marked using CT guidance. The skin
was prepped and draped in the usual sterile fashion using Betadine
soap.

After local anesthesia with 1% lidocaine without epinephrine and
subsequent deep anesthesia, a 3.5 inch 22 gauge spinal needle was
advanced into the right SI joint. Injection of 0.5 ml Isovue-M 200
confirmed intra-articular placement. No vascular uptake present.
Subsequently, 80 mg of Depo-Medrol mixed with 1 mL of 0.25%
bupivacaine were injected into the right SI joint. Needle was
removed and a sterile dressing applied.

No complications were observed. The patient was observed and
released under the care of a driver after 30 minutes.
IMPRESSION: Successful CT guided right SI joint injection.
